# Patient Record
Sex: Male | Born: 1990 | Race: White | Hispanic: No | Marital: Single | Smoking: Never smoker
Health system: Southern US, Community
[De-identification: ages and names within clinical notes are randomized; demographics above are authoritative.]

## PROBLEM LIST (undated history)

## (undated) DIAGNOSIS — K219 Gastro-esophageal reflux disease without esophagitis: Secondary | ICD-10-CM

## (undated) DIAGNOSIS — T7840XA Allergy, unspecified, initial encounter: Secondary | ICD-10-CM

## (undated) HISTORY — DX: Allergy, unspecified, initial encounter: T78.40XA

## (undated) HISTORY — PX: NO PAST SURGERIES: SHX2092

---
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
## 2021-03-27 ENCOUNTER — Other Ambulatory Visit: Payer: Self-pay

## 2021-03-27 ENCOUNTER — Ambulatory Visit
Admission: EM | Admit: 2021-03-27 | Discharge: 2021-03-27 | Disposition: A | Discharge status: Home / Self Care | Payer: BC Managed Care – PPO

## 2021-03-27 ENCOUNTER — Ambulatory Visit (INDEPENDENT_AMBULATORY_CARE_PROVIDER_SITE_OTHER): Payer: BC Managed Care – PPO

## 2021-03-27 DIAGNOSIS — S93521A Sprain of metatarsophalangeal joint of right great toe, initial encounter: Secondary | ICD-10-CM

## 2021-03-27 DIAGNOSIS — M79674 Pain in right toe(s): Secondary | ICD-10-CM | POA: Diagnosis not present

## 2021-03-27 HISTORY — DX: Gastro-esophageal reflux disease without esophagitis: K21.9

## 2021-03-27 NOTE — ED Provider Notes (Signed)
[facility]    CSN: [id number] Arrival date & time: 03/27/21  1342      History   Chief Complaint Chief Complaint  Patient presents with   Toe Pain    Right big toe     HPI Dwayne Hill is a 30 y.o. male.   HPI  30 year old male here for evaluation of pain in his right big toe.  Patient reports that he has been experiencing pain in the proximal phalanx of his right big toe for the last 2 weeks.  He states that he is been experiencing some increase in swelling of the toe which brought him in today.  He denies any injury or redness but he does report that 2 weeks ago it did appear bruised.  Patient not had any numbness or tingling in the toe.  Patient reports that he stands on his feet all day at his job which makes the pain worse.  Patient reports that the pain reminds him of having turf toe when he was in high school.  He does endorse that there was redness and swelling along the medial edge of the toe back to the MTP joint a couple of days ago which has resolved.  Past Medical History:  Diagnosis Date   GERD (gastroesophageal reflux disease)     There are no problems to display for this patient.   Past Surgical History:  Procedure Laterality Date   NO PAST SURGERIES         Home Medications    Prior to Admission medications   Medication Sig Start Date End Date Taking? Authorizing Provider  omeprazole (PRILOSEC) 20 MG capsule Take 20 mg by mouth daily.   Yes [provider]    Family History Family History  Problem Relation Age of Onset   Healthy Mother    Hypertension Father     Social History Social History   Tobacco Use   Smoking status: Never   Smokeless tobacco: Current  Vaping Use   Vaping Use: Never used  Substance Use Topics   Alcohol use: Yes    Comment: occasionally   Drug use: Yes    Types: Marijuana     Allergies   Penicillins   Review of Systems Review of Systems  Musculoskeletal:  Positive for  arthralgias. Negative for joint swelling and myalgias.  Skin:  Negative for color change.  Hematological: Negative.   Psychiatric/Behavioral: Negative.      Physical Exam Triage Vital Signs ED Triage Vitals  Enc Vitals Group     BP 03/27/21 1422 (!) 155/99     Pulse Rate 03/27/21 1422 62     Resp 03/27/21 1422 16     Temp 03/27/21 1422 98.2 F (36.8 C)     Temp Source 03/27/21 1422 Oral     SpO2 03/27/21 1422 99 %     Weight 03/27/21 1419 (!) 320 lb (145.2 kg)     Height 03/27/21 1419 6\' 2"  (1.88 m)     Head Circumference --      Peak Flow --      Pain Score 03/27/21 1419 6     Pain Loc --      Pain Edu? --      Excl. in GC? --    No data found.  Updated Vital Signs BP (!) 155/99 (BP Location: Right Arm)   Pulse 62   Temp 98.2 F (36.8 C) (Oral)   Resp 16   Ht 6\' 2"  (1.88 m)  Wt (!) 320 lb (145.2 kg)   SpO2 99%   BMI 41.09 kg/m   Visual Acuity Right Eye Distance:   Left Eye Distance:   Bilateral Distance:    Right Eye Near:   Left Eye Near:    Bilateral Near:     Physical Exam Vitals and nursing note reviewed.  Constitutional:      General: He is not in acute distress.    Appearance: Normal appearance. He is obese. He is not ill-appearing.  Musculoskeletal:        General: Swelling and tenderness present. No deformity. Normal range of motion.  Skin:    General: Skin is warm and dry.     Capillary Refill: Capillary refill takes less than 2 seconds.     Findings: No bruising or erythema.  Neurological:     General: No focal deficit present.     Mental Status: He is alert and oriented to person, place, and time.     Sensory: No sensory deficit.  Psychiatric:        Mood and Affect: Mood normal.        Behavior: Behavior normal.        Thought Content: Thought content normal.        Judgment: Judgment normal.     UC Treatments / Results  Labs (all labs ordered are listed, but only abnormal results are displayed) Labs Reviewed - No data to  display  EKG   Radiology DG Toe Great Right  Result Date: 03/27/2021 CLINICAL DATA:  Right great toe pain for the past 2 weeks. No injury. EXAM: RIGHT GREAT TOE COMPARISON:  None. FINDINGS: There is no evidence of fracture or dislocation. There is no evidence of arthropathy or other focal bone abnormality. Soft tissues are unremarkable. IMPRESSION: Negative. Electronically Signed   By: Obie Dredge M.D.   On: 03/27/2021 15:44    Procedures Procedures (including critical care time)  Medications Ordered in UC Medications - No data to display  Initial Impression / Assessment and Plan / UC Course  I have reviewed the triage vital signs and the nursing notes.  Pertinent labs & imaging results that were available during my care of the patient were reviewed by me and considered in my medical decision making (see chart for details).  Patient is a nontoxic-appearing 30 year old male here for evaluation of 2 weeks worth of right great toe pain that is is occurring outside the setting of any injury.  Patient's physical exam reveals her right great toe there is a normal anatomical alignment.  There is no tenderness with palpation of the MTP or the IP joints.  Patient does have tenderness with palpation of the proximal phalanx of the great toe.  There is mild erythema to the toe with out induration, fluctuance, or heat.  There is very minimal edema present.  Cap refill less than 2 seconds.  Patient has full range of motion of the toe.  Will obtain radiograph of right great toe to look for possible stress fracture.  X-ray of right great toe independently reviewed and evaluated by me.  No evidence of fracture or dislocation.  Awaiting radiology overread. Radiology over read is also negative for fracture or dislocation.  Will discharge patient home with a diagnosis of right great toe sprain and treat with NSAIDs, elevation, and warm Epsom salt soaks.   Final Clinical Impressions(s) / UC Diagnoses    Final diagnoses:  Turf toe of right foot     Discharge Instructions  Use over-the-counter ibuprofen, 600 mg (3 tablets) every 6 hours with food, as needed for pain and inflammation.  Keep your right foot elevated is much as possible to help decrease swelling and aid in pain relief.  Continue to soak your foot twice daily in warm water and Epsom salts to help improve circulation.  Return for reevaluation for any new or worsening symptoms.     ED Prescriptions   None    PDMP not reviewed this encounter.   Becky Augusta, NP 03/27/21 [phone]

## 2021-03-27 NOTE — ED Triage Notes (Signed)
Patient complains of right big toe pain that started around 2 weeks ago without relief. States that yesterday it was very swollen. Denies known injury.

## 2021-03-27 NOTE — Discharge Instructions (Addendum)
Use over-the-counter ibuprofen, 600 mg (3 tablets) every 6 hours with food, as needed for pain and inflammation.  Keep your right foot elevated is much as possible to help decrease swelling and aid in pain relief.  Continue to soak your foot twice daily in warm water and Epsom salts to help improve circulation.  Return for reevaluation for any new or worsening symptoms.

## 2022-03-05 ENCOUNTER — Encounter: Payer: Self-pay | Admitting: Physician Assistant

## 2022-03-05 ENCOUNTER — Ambulatory Visit: Payer: BC Managed Care – PPO | Admitting: Physician Assistant

## 2022-03-05 ENCOUNTER — Ambulatory Visit: Payer: Self-pay | Admitting: Physician Assistant

## 2022-03-05 VITALS — BP 144/90 | HR 67 | Temp 98.4°F | Resp 16 | Ht 74.0 in | Wt 339.9 lb

## 2022-03-05 DIAGNOSIS — R030 Elevated blood-pressure reading, without diagnosis of hypertension: Secondary | ICD-10-CM | POA: Diagnosis not present

## 2022-03-05 DIAGNOSIS — M5489 Other dorsalgia: Secondary | ICD-10-CM | POA: Diagnosis not present

## 2022-03-05 DIAGNOSIS — R29818 Other symptoms and signs involving the nervous system: Secondary | ICD-10-CM | POA: Insufficient documentation

## 2022-03-05 DIAGNOSIS — K219 Gastro-esophageal reflux disease without esophagitis: Secondary | ICD-10-CM | POA: Diagnosis not present

## 2022-03-05 DIAGNOSIS — R5383 Other fatigue: Secondary | ICD-10-CM | POA: Insufficient documentation

## 2022-03-05 DIAGNOSIS — Z0000 Encounter for general adult medical examination without abnormal findings: Secondary | ICD-10-CM

## 2022-03-05 NOTE — Progress Notes (Signed)
I,Dwayne Hill,acting as a Neurosurgeon for [employer], PA-C.,have documented all relevant documentation on the behalf of Dwayne Lat, PA-C,as directed by  [employer], PA-C while in the presence of [employer], PA-C.  New Patient Office Visit  Subjective    Patient ID: Dwayne Hill, male    DOB: [birth year]  Age: 31 y.o. MRN: [id number]  CC: est care, GERD, knots, possible sleep apnea  Chief Complaint  Patient presents with   Establish Care    HPI Dwayne Hill is a 31 yr old male presenting to establish care.  Patient reports a knot just under each shoulder and right thigh.  Noticed about a yr ago.  Reports no pain or discomfort or increase in size.   Also reports heartburn. Omeprazole helps.   Patient's wife reports he stops breathing in his sleep for 30-45 seconds at a time every night.  Patient wonders if he has sleep apnea.    Outpatient Encounter Medications as of 03/05/2022  Medication Sig   omeprazole (PRILOSEC) 20 MG capsule Take 20 mg by mouth daily.   No facility-administered encounter medications on file as of 03/05/2022.    Past Medical History:  Diagnosis Date   Allergy    GERD (gastroesophageal reflux disease)     Past Surgical History:  Procedure Laterality Date   NO PAST SURGERIES      Family History  Problem Relation Age of Onset   Healthy Mother    Hypertension Father    Depression Father    Bipolar disorder Father     Social History   Socioeconomic History   Marital status: Single    Spouse name: Not on file   Number of children: Not on file   Years of education: Not on file   Highest education level: Not on file  Occupational History   Not on file  Tobacco Use   Smoking status: Never   Smokeless tobacco: Current    Types: Snuff  Vaping Use   Vaping Use: Never used  Substance and Sexual Activity   Alcohol use: Yes    Alcohol/week: 24.0 standard drinks of alcohol    Types: 24 Cans of beer per week    Comment: weekly   Drug  use: Yes    Types: Marijuana   Sexual activity: Not on file  Other Topics Concern   Not on file  Social History Narrative   Not on file   Social Determinants of Health   Financial Resource Strain: Not on file  Food Insecurity: Not on file  Transportation Needs: Not on file  Physical Activity: Not on file  Stress: Not on file  Social Connections: Not on file  Intimate Partner Violence: Not on file    Review of Systems  Constitutional:  Positive for malaise/fatigue.  Musculoskeletal:  Positive for back pain and neck pain.  Endo/Heme/Allergies:  Positive for environmental allergies.  All other systems reviewed and are negative.       Objective    BP (!) 144/90 (BP Location: Left Arm, Patient Position: Sitting, Cuff Size: Large)   Pulse 67   Temp 98.4 F (36.9 C) (Oral)   Resp 16   Ht 6\' 2"  (1.88 m)   Wt (!) 339 lb 14.4 oz (154.2 kg)   SpO2 97%   BMI 43.64 kg/m   Physical Exam Vitals reviewed.  Constitutional:      General: He is not in acute distress.    Appearance: Normal appearance. He is well-developed. He is  obese. He is not ill-appearing, toxic-appearing or diaphoretic.  HENT:     Head: Normocephalic and atraumatic.     Right Ear: Tympanic membrane, ear canal and external ear normal.     Left Ear: Tympanic membrane, ear canal and external ear normal.     Nose: Nose normal.     Mouth/Throat:     Mouth: Mucous membranes are moist.     Pharynx: Oropharynx is clear. No oropharyngeal exudate or posterior oropharyngeal erythema.  Eyes:     General: No scleral icterus.    Extraocular Movements: Extraocular movements intact.     Conjunctiva/sclera: Conjunctivae normal.     Pupils: Pupils are equal, round, and reactive to light.  Neck:     Thyroid: No thyromegaly.  Cardiovascular:     Rate and Rhythm: Normal rate and regular rhythm.     Pulses: Normal pulses.     Heart sounds: Normal heart sounds. No murmur heard. Pulmonary:     Effort: Pulmonary effort is  normal. No respiratory distress.     Breath sounds: Normal breath sounds. No wheezing or rales.  Abdominal:     General: Abdomen is flat. Bowel sounds are normal. There is no distension.     Palpations: Abdomen is soft.     Tenderness: There is no abdominal tenderness.  Musculoskeletal:        General: No deformity. Normal range of motion.     Cervical back: Normal range of motion and neck supple.     Right lower leg: No edema.     Left lower leg: No edema.  Lymphadenopathy:     Cervical: No cervical adenopathy.  Skin:    General: Skin is warm and dry.     Findings: No rash.  Neurological:     General: No focal deficit present.     Mental Status: He is alert and oriented to person, place, and time. Mental status is at baseline.     Cranial Nerves: No cranial nerve deficit.     Sensory: No sensory deficit.     Motor: No weakness.     Gait: Gait normal.  Psychiatric:        Mood and Affect: Mood normal.        Behavior: Behavior normal.        Thought Content: Thought content normal.        Judgment: Judgment normal.         Assessment & Plan:   Problem List Items Addressed This Visit   None Visit Diagnoses     Gastroesophageal reflux disease without esophagitis    -  Primary   Back pain without sciatica       Morbid obesity ([place])       Relevant Orders   CBC with Differential/Platelet   Comprehensive metabolic panel   TSH   Lipid panel   Ambulatory referral to Sleep Studies   Elevated BP without diagnosis of hypertension       Encounter for medical examination to establish care       Suspected sleep apnea       Fatigue, unspecified type         Gastroesophageal reflux disease without esophagitis Continue Omeprazole 20 mg daily Elevate the head of the bed 6-8 inches, avoid recumbency for 3 hours after eating, avoid food as a delayed gastric emptying, weight loss    Back pain without sciatica Proceed with topical antiinflammatory Advised to proceed with OTC  pain medications, topical anti-inflammatory, heat/ice compresses  Recommended to start PT. Pt prefers to wait    Morbid obesity ([facility]) Initial workup: - CBC with Differential/Platelet - Comprehensive metabolic panel - TSH - Lipid panel Lifestyle modifications recommended   Elevated BP without diagnosis of hypertension BP today was 144/90 Pt was encouraged to measure his bp at home and bring his bp log with him to the next appt  Fatigue Initial workup: - TSH, CBC, CMP  Suspected sleep apnea Needs sleep studies  Establish care  FU in 1 mo  CPE in 2 mo  The patient was advised to call back or seek an in-person evaluation if the symptoms worsen or if the condition fails to improve as anticipated.  I discussed the assessment and treatment plan with the patient. The patient was provided an opportunity to ask questions and all were answered. The patient agreed with the plan and demonstrated an understanding of the instructions.  The entirety of the information documented in the History of Present Illness, Review of Systems and Physical Exam were personally obtained by me. Portions of this information were initially documented by the CMA and reviewed by me for thoroughness and accuracy.  Portions of this note were created using dictation software and may contain typographical errors.       Total encounter time more than 45 minutes Greater than 50% was spent in counseling and coordination of care with the patient  Dwayne Hill, [facility], MMS [facility] [phone] (phone) [phone] (fax)

## 2022-03-06 LAB — CBC WITH DIFFERENTIAL/PLATELET
Basophils Absolute: 0.1 10*3/uL (ref 0.0–0.2)
Basos: 1 %
EOS (ABSOLUTE): 0.3 10*3/uL (ref 0.0–0.4)
Eos: 2 %
Hematocrit: 44.7 % (ref 37.5–51.0)
Hemoglobin: 15.2 g/dL (ref 13.0–17.7)
Immature Grans (Abs): 0.1 10*3/uL (ref 0.0–0.1)
Immature Granulocytes: 1 %
Lymphocytes Absolute: 2.5 10*3/uL (ref 0.7–3.1)
Lymphs: 19 %
MCH: 28.3 pg (ref 26.6–33.0)
MCHC: 34 g/dL (ref 31.5–35.7)
MCV: 83 fL (ref 79–97)
Monocytes Absolute: 0.9 10*3/uL (ref 0.1–0.9)
Monocytes: 7 %
Neutrophils Absolute: 9.2 10*3/uL — ABNORMAL HIGH (ref 1.4–7.0)
Neutrophils: 70 %
Platelets: 320 10*3/uL (ref 150–450)
RBC: 5.37 x10E6/uL (ref 4.14–5.80)
RDW: 12.8 % (ref 11.6–15.4)
WBC: 13 10*3/uL — ABNORMAL HIGH (ref 3.4–10.8)

## 2022-03-06 LAB — TSH: TSH: 3.68 u[IU]/mL (ref 0.450–4.500)

## 2022-03-06 LAB — COMPREHENSIVE METABOLIC PANEL
ALT: 26 IU/L (ref 0–44)
AST: 21 IU/L (ref 0–40)
Albumin/Globulin Ratio: 2.2 (ref 1.2–2.2)
Albumin: 4.9 g/dL (ref 4.1–5.2)
Alkaline Phosphatase: 99 IU/L (ref 44–121)
BUN/Creatinine Ratio: 14 (ref 9–20)
BUN: 12 mg/dL (ref 6–20)
Bilirubin Total: 0.3 mg/dL (ref 0.0–1.2)
CO2: 24 mmol/L (ref 20–29)
Calcium: 9.9 mg/dL (ref 8.7–10.2)
Chloride: 101 mmol/L (ref 96–106)
Creatinine, Ser: 0.87 mg/dL (ref 0.76–1.27)
Globulin, Total: 2.2 g/dL (ref 1.5–4.5)
Glucose: 80 mg/dL (ref 70–99)
Potassium: 4.4 mmol/L (ref 3.5–5.2)
Sodium: 139 mmol/L (ref 134–144)
Total Protein: 7.1 g/dL (ref 6.0–8.5)
eGFR: 120 mL/min/{1.73_m2} (ref >59)

## 2022-03-06 LAB — LIPID PANEL
Chol/HDL Ratio: 4.5 ratio (ref 0.0–5.0)
Cholesterol, Total: 200 mg/dL — ABNORMAL HIGH (ref 100–199)
HDL: 44 mg/dL (ref >39)
LDL Chol Calc (NIH): 117 mg/dL — ABNORMAL HIGH (ref 0–99)
Triglycerides: 226 mg/dL — ABNORMAL HIGH (ref 0–149)
VLDL Cholesterol Cal: 39 mg/dL (ref 5–40)

## 2022-03-07 NOTE — Progress Notes (Signed)
Hello Gerlene Fee ,   Your labwork results all are within normal limits, except some increase in white blood cells /neutrophils. It could be secondary to your current problems with GERD. We could recheck them at your next appt. Also, your cholesterol and lipids are slightly elevated. Lifestyle modifications are recommended: healthy diet, regular exercise and weight loss.  Any questions please reach out to the office or message me on MyChart! Best, [name], PA-C

## 2022-03-16 DIAGNOSIS — G4730 Sleep apnea, unspecified: Secondary | ICD-10-CM | POA: Diagnosis not present

## 2022-04-03 NOTE — Progress Notes (Deleted)
    I,Jana Analiya Porco,acting as a Neurosurgeon for [employer], PA-C.,have documented all relevant documentation on the behalf of [name], PA-C,as directed by  [employer], PA-C while in the presence of [employer], PA-C.  Established patient visit   Patient: Dwayne Hill   DOB: Dec 07, 1990   31 y.o. Male  MRN: [id number] Visit Date: 04/04/2022  Today's healthcare provider: [name], PA-C   No chief complaint on file. Follow up GERD, back pain, elevated GP Subjective    GERD, Follow up:  The patient was last seen for GERD 1 months ago. Changes made since that visit include continue Omeprazole. Elevate the head of the bed 6-8 inches, avoid recumbency for 3 hours after eating, avoid food as a delayed gastric emptying, weight loss    He reports {excellent/good/fair/poor:19665} compliance with treatment. He {is/is not:21021397} having side effects. ***.  -----------------------------------------------------------------------------------------  Follow up for back pain  The patient was last seen for this 1 months ago. Changes made at last visit include .Proceed with topical antiinflammatory Advised to proceed with OTC pain medications, topical anti-inflammatory, heat/ice compresses Recommended to start PT. Pt prefers to wait  He reports {excellent/good/fair/poor:19665} compliance with treatment. He feels that condition is {improved/worse/unchanged:3041574}. He {is/is not:21021397} having side effects. ***  ----------------------------------------------------------------------------------------- Follow up for elevated BP   The patient was last seen for this 1 months ago. Changes made at last visit include: BP today was 144/90 Pt was encouraged to measure his bp at home and bring his bp log with him to the next appt He reports {excellent/good/fair/poor:19665} compliance with treatment. He feels that condition is {improved/worse/unchanged:3041574}. He {is/is not:21021397}  having side effects. ***  -----------------------------------------------------------------------------------------  Medications: Outpatient Medications Prior to Visit  Medication Sig   omeprazole (PRILOSEC) 20 MG capsule Take 20 mg by mouth daily.   No facility-administered medications prior to visit.    Review of Systems  {Labs  Heme  Chem  Endocrine  Serology  Results Review (optional):23779}   Objective    There were no vitals taken for this visit. {Show previous vital signs (optional):23777}  Physical Exam  ***  No results found for any visits on 04/04/22.  Assessment & Plan     ***  No follow-ups on file.      {provider attestation***:1}   [name], Cordelia Poche  [facility] [phone] (phone) [phone] (fax)  [facility]

## 2022-04-04 ENCOUNTER — Ambulatory Visit: Payer: BC Managed Care – PPO | Admitting: Physician Assistant

## 2022-04-05 ENCOUNTER — Telehealth: Payer: Self-pay

## 2022-04-05 NOTE — Telephone Encounter (Signed)
Copied from CRM [id number]. Topic: General - Inquiry >> Apr 05, 2022  4:25 PM Haroldine Laws wrote: Reason for CRM: Carollee Herter may with [employer] called saying she was faxing over some paperwork and would like provider to ccall her if she has any questions.  It is regarding his sleep study  [phone]

## 2022-04-18 NOTE — Progress Notes (Signed)
I,Dwayne Hill,acting as a Neurosurgeon for [employer], PA-C.,have documented all relevant documentation on the behalf of Dwayne Lat, PA-C,as directed by  [employer], PA-C while in the presence of [employer], PA-C.   Complete physical exam   Patient: Dwayne Hill   DOB: [birth year]   30 y.o. Male  MRN: [id number] Visit Date: 04/19/2022  Today's healthcare provider: [name], PA-C   Chief Complaint  Patient presents with   Annual Exam   Subjective    Dwayne Hill is a 31 y.o. male who presents today for a complete physical exam.  He reports consuming a general diet.Marland Kitchen He generally feels fairly well. He reports sleeping poorly. He does have additional problems to discuss today.  Trouble sleeping/Reports he has CPAP on the way.   Patient is taking Skald supplement for "fat burning and weight lose." Does exercise regularly  Past Medical History:  Diagnosis Date   Allergy    GERD (gastroesophageal reflux disease)    Past Surgical History:  Procedure Laterality Date   NO PAST SURGERIES     Social History   Socioeconomic History   Marital status: Single    Spouse name: Not on file   Number of children: Not on file   Years of education: Not on file   Highest education level: Not on file  Occupational History   Not on file  Tobacco Use   Smoking status: Never   Smokeless tobacco: Current    Types: Snuff  Vaping Use   Vaping Use: Never used  Substance and Sexual Activity   Alcohol use: Yes    Alcohol/week: 24.0 standard drinks of alcohol    Types: 24 Cans of beer per week    Comment: weekly   Drug use: Yes    Types: Marijuana   Sexual activity: Not on file  Other Topics Concern   Not on file  Social History Narrative   Not on file   Social Determinants of Health   Financial Resource Strain: Not on file  Food Insecurity: Not on file  Transportation Needs: Not on file  Physical Activity: Not on file  Stress: Not on file  Social Connections:  Not on file  Intimate Partner Violence: Not on file   Family Status  Relation Name Status   Mother  Alive   Father  Alive   Family History  Problem Relation Age of Onset   Healthy Mother    Hypertension Father    Depression Father    Bipolar disorder Father    Allergies  Allergen Reactions   Penicillins Hives and Shortness Of Breath    Patient Care Team: Dwayne Lat, PA-C as PCP - General (Physician Assistant)   Medications: Outpatient Medications Prior to Visit  Medication Sig   omeprazole (PRILOSEC) 20 MG capsule Take 20 mg by mouth daily.   No facility-administered medications prior to visit.    Review of Systems  Constitutional:  Positive for fatigue.  Psychiatric/Behavioral:  Positive for sleep disturbance.   All other systems reviewed and are negative.     Objective      Vitals:   04/19/22 0849 04/19/22 0857  BP: (!) 142/86 (!) 138/98  Pulse: 73   Resp: 16   Temp: 98.1 F (36.7 C)   SpO2: 100%     Physical Exam Vitals reviewed.  Constitutional:      General: He is not in acute distress.    Appearance: Normal appearance. He is well-developed. He is not diaphoretic.  HENT:     Head: Normocephalic and atraumatic.     Right Ear: Tympanic membrane, ear canal and external ear normal.     Left Ear: Tympanic membrane, ear canal and external ear normal.     Nose: Nose normal.     Mouth/Throat:     Mouth: Mucous membranes are moist.     Pharynx: Oropharynx is clear. No oropharyngeal exudate.  Eyes:     General: No scleral icterus.    Conjunctiva/sclera: Conjunctivae normal.     Pupils: Pupils are equal, round, and reactive to light.  Neck:     Thyroid: No thyromegaly.  Cardiovascular:     Rate and Rhythm: Normal rate and regular rhythm.     Pulses: Normal pulses.     Heart sounds: Normal heart sounds. No murmur heard. Pulmonary:     Effort: Pulmonary effort is normal. No respiratory distress.     Breath sounds: Normal breath sounds. No  wheezing or rales.  Abdominal:     General: There is no distension.     Palpations: Abdomen is soft.     Tenderness: There is no abdominal tenderness.  Musculoskeletal:        General: No deformity.     Cervical back: Neck supple.     Right lower leg: No edema.     Left lower leg: No edema.  Lymphadenopathy:     Cervical: No cervical adenopathy.  Skin:    General: Skin is warm and dry.     Findings: No rash.  Neurological:     Mental Status: He is alert and oriented to person, place, and time. Mental status is at baseline.     Sensory: No sensory deficit.     Motor: No weakness.     Gait: Gait normal.  Psychiatric:        Mood and Affect: Mood normal.        Behavior: Behavior normal.        Thought Content: Thought content normal.       Last depression screening scores    04/19/2022    8:52 AM 03/05/2022    1:51 PM  PHQ 2/9 Scores  PHQ - 2 Score 2 0  PHQ- 9 Score 6 5   Last fall risk screening    04/19/2022    8:52 AM  Fall Risk   Falls in the past year? 0  Number falls in past yr: 0  Injury with Fall? 0  Follow up Falls evaluation completed   Last Audit-C alcohol use screening    04/19/2022    8:53 AM  Alcohol Use Disorder Test (AUDIT)  1. How often do you have a drink containing alcohol? 4  2. How many drinks containing alcohol do you have on a typical day when you are drinking? 3  3. How often do you have six or more drinks on one occasion? 4  AUDIT-C Score 11   A score of 3 or more in women, and 4 or more in men indicates increased risk for alcohol abuse, EXCEPT if all of the points are from question 1   No results found for any visits on 04/19/22.  Assessment & Plan    Routine Health Maintenance and Physical Exam  Exercise Activities and Dietary recommendations  Goals   Weight loss of 5% of current weight via healthy diet and daily exercise recommended      There is no immunization history on file for this patient.  Health Maintenance  Topic  Date Due   COVID-19 Vaccine (1) Never done   HIV Screening  Never done   Hepatitis C Screening  Never done   TETANUS/TDAP  Never done   INFLUENZA VACCINE  04/17/2022   HPV VACCINES  Aged Out    Discussed health benefits of physical activity, and encouraged him to engage in regular exercise appropriate for his age and condition. 1. Gastroesophageal reflux disease without esophagitis  Continue PPI Elevate the head of the bed 6-8 inches, avoid recumbency for 3 hours after eating, avoid food as a delayed gastric emptying, weight loss   2. Elevated BP without diagnosis of hypertension  BP 138/98  Measure BP at home Might need to start BP medication Weight loss of 5 % of the current weight advised  3. Back pain without sciatica    Symptomatic treatment  4. Fatigue, unspecified type Could be related to sleep apnea Will reassess after using CPAP machine  5. Need for hepatitis C screening test  - Hepatitis C antibody  6. Encounter for screening for HIV  - HIV Antibody (routine testing w rflx)      FU? The patient was advised to call back or seek an in-person evaluation if the symptoms worsen or if the condition fails to improve as anticipated.  I discussed the assessment and treatment plan with the patient. The patient was provided an opportunity to ask questions and all were answered. The patient agreed with the plan and demonstrated an understanding of the instructions.  The entirety of the information documented in the History of Present Illness, Review of Systems and Physical Exam were personally obtained by me. Portions of this information were initially documented by the CMA and reviewed by me for thoroughness and accuracy.  Portions of this note were created using dictation software and may contain typographical errors.     Dwayne Lat, PA-C  [facility] [phone] (phone) [phone] (fax)  [facility]

## 2022-04-19 ENCOUNTER — Encounter: Payer: Self-pay | Admitting: Physician Assistant

## 2022-04-19 ENCOUNTER — Ambulatory Visit (INDEPENDENT_AMBULATORY_CARE_PROVIDER_SITE_OTHER): Payer: BC Managed Care – PPO | Admitting: Physician Assistant

## 2022-04-19 VITALS — BP 138/98 | HR 73 | Temp 98.1°F | Resp 16 | Ht 74.4 in | Wt 333.3 lb

## 2022-04-19 DIAGNOSIS — R5383 Other fatigue: Secondary | ICD-10-CM | POA: Diagnosis not present

## 2022-04-19 DIAGNOSIS — Z23 Encounter for immunization: Secondary | ICD-10-CM

## 2022-04-19 DIAGNOSIS — R030 Elevated blood-pressure reading, without diagnosis of hypertension: Secondary | ICD-10-CM | POA: Diagnosis not present

## 2022-04-19 DIAGNOSIS — M5489 Other dorsalgia: Secondary | ICD-10-CM | POA: Diagnosis not present

## 2022-04-19 DIAGNOSIS — K219 Gastro-esophageal reflux disease without esophagitis: Secondary | ICD-10-CM

## 2022-04-19 DIAGNOSIS — Z114 Encounter for screening for human immunodeficiency virus [HIV]: Secondary | ICD-10-CM

## 2022-04-19 DIAGNOSIS — Z1159 Encounter for screening for other viral diseases: Secondary | ICD-10-CM

## 2022-04-19 DIAGNOSIS — Z0000 Encounter for general adult medical examination without abnormal findings: Secondary | ICD-10-CM

## 2022-04-30 DIAGNOSIS — G4733 Obstructive sleep apnea (adult) (pediatric): Secondary | ICD-10-CM | POA: Diagnosis not present

## 2022-04-30 DIAGNOSIS — R0683 Snoring: Secondary | ICD-10-CM | POA: Diagnosis not present

## 2022-05-01 DIAGNOSIS — K219 Gastro-esophageal reflux disease without esophagitis: Secondary | ICD-10-CM | POA: Diagnosis not present

## 2022-05-31 DIAGNOSIS — G4733 Obstructive sleep apnea (adult) (pediatric): Secondary | ICD-10-CM | POA: Diagnosis not present

## 2022-05-31 DIAGNOSIS — R0683 Snoring: Secondary | ICD-10-CM | POA: Diagnosis not present

## 2022-06-01 ENCOUNTER — Ambulatory Visit: Payer: BC Managed Care – PPO | Admitting: Physician Assistant

## 2022-06-01 ENCOUNTER — Encounter: Payer: Self-pay | Admitting: Physician Assistant

## 2022-06-01 VITALS — BP 140/78 | HR 80 | Temp 98.5°F | Wt 333.0 lb

## 2022-06-01 DIAGNOSIS — R030 Elevated blood-pressure reading, without diagnosis of hypertension: Secondary | ICD-10-CM | POA: Diagnosis not present

## 2022-06-01 DIAGNOSIS — Z23 Encounter for immunization: Secondary | ICD-10-CM | POA: Diagnosis not present

## 2022-06-01 DIAGNOSIS — G4733 Obstructive sleep apnea (adult) (pediatric): Secondary | ICD-10-CM

## 2022-06-01 DIAGNOSIS — K219 Gastro-esophageal reflux disease without esophagitis: Secondary | ICD-10-CM | POA: Diagnosis not present

## 2022-06-01 MED ORDER — LOSARTAN POTASSIUM 25 MG PO TABS: 25.0000 mg | ORAL_TABLET | Freq: Every day | ORAL | 0 refills | Status: AC

## 2022-06-01 NOTE — Progress Notes (Unsigned)
I,Roshena L Chambers,acting as a Education administrator for [employer], PA-C.,have documented all relevant documentation on the behalf of Mardene Speak, PA-C,as directed by  [employer], PA-C while in the presence of [employer], PA-C.  Established patient visit   Patient: Dwayne Hill   DOB: 10-Feb-1991   31 y.o. Male  MRN: [id number] Visit Date: 06/01/2022  Today's healthcare provider: Mardene Speak, PA-C   Chief Complaint  Patient presents with   Hypertension   Subjective    HPI  Hypertension, follow-up  BP Readings from Last 3 Encounters:  04/19/22 (!) 138/98  03/05/22 (!) 144/90  03/27/21 (!) 155/99   Wt Readings from Last 3 Encounters:  06/01/22 (!) 333 lb (151 kg)  04/19/22 (!) 333 lb 4.8 oz (151.2 kg)  03/05/22 (!) 339 lb 14.4 oz (154.2 kg)     He was last seen for hypertension on 04/19/2022.   BP at that visit was 138/98. Management since that visit includes encouraging patient to work on weight loss.  He reports good compliance with treatment. He is not having side effects.  He is following a Regular diet. He is not exercising. He does not smoke.  Use of agents associated with hypertension: none.   Outside blood pressures are not checked. Symptoms: No chest pain No chest pressure  No palpitations No syncope  No dyspnea No orthopnea  No paroxysmal nocturnal dyspnea No lower extremity edema   Pertinent labs Lab Results  Component Value Date   CHOL 200 (H) 03/05/2022   HDL 44 03/05/2022   LDLCALC 117 (H) 03/05/2022   TRIG 226 (H) 03/05/2022   CHOLHDL 4.5 03/05/2022   Lab Results  Component Value Date   NA 139 03/05/2022   K 4.4 03/05/2022   CREATININE 0.87 03/05/2022   EGFR 120 03/05/2022   GLUCOSE 80 03/05/2022   TSH 3.680 03/05/2022     The ASCVD Risk score (Arnett DK, et al., 2019) failed to calculate for the following reasons:   The 2019 ASCVD risk score is only valid for ages 8 to  20  ---------------------------------------------------------------------------------------------------   Follow up for sleep apnea:  The patient was last seen for this 1  month  ago. Changes made at last visit include none; continue using CPAP.  He reports good compliance with treatment. He feels that condition is Improved. He is not having side effects.   -----------------------------------------------------------------------------------------   Medications: Outpatient Medications Prior to Visit  Medication Sig   omeprazole (PRILOSEC) 20 MG capsule Take 20 mg by mouth daily.   No facility-administered medications prior to visit.    Review of Systems  Constitutional:  Negative for appetite change, chills and fever.  Respiratory:  Negative for chest tightness, shortness of breath and wheezing.   Cardiovascular:  Negative for chest pain and palpitations.  Gastrointestinal:  Negative for abdominal pain, nausea and vomiting.    {Labs  Heme  Chem  Endocrine  Serology  Results Review (optional):23779}   Objective    Pulse 80   Temp 98.5 F (36.9 C) (Oral)   Wt (!) 333 lb (151 kg)   SpO2 98% Comment: room air  BMI 42.30 kg/m  {Show previous vital signs (optional):23777}  Physical Exam  ***  No results found for any visits on 06/01/22.  Assessment & Plan     ***  No follow-ups on file.      {provider attestation***:1}   Mardene Speak, Hershal Coria  [facility] [phone] (phone) [phone] (fax)  [facility]

## 2022-06-20 DIAGNOSIS — G4733 Obstructive sleep apnea (adult) (pediatric): Secondary | ICD-10-CM | POA: Diagnosis not present

## 2022-06-30 DIAGNOSIS — R0683 Snoring: Secondary | ICD-10-CM | POA: Diagnosis not present

## 2022-06-30 DIAGNOSIS — G4733 Obstructive sleep apnea (adult) (pediatric): Secondary | ICD-10-CM | POA: Diagnosis not present

## 2022-07-01 DIAGNOSIS — K219 Gastro-esophageal reflux disease without esophagitis: Secondary | ICD-10-CM | POA: Diagnosis not present

## 2022-07-21 DIAGNOSIS — G4733 Obstructive sleep apnea (adult) (pediatric): Secondary | ICD-10-CM | POA: Diagnosis not present

## 2022-07-31 DIAGNOSIS — R0683 Snoring: Secondary | ICD-10-CM | POA: Diagnosis not present

## 2022-07-31 DIAGNOSIS — G4733 Obstructive sleep apnea (adult) (pediatric): Secondary | ICD-10-CM | POA: Diagnosis not present

## 2022-08-20 DIAGNOSIS — G4733 Obstructive sleep apnea (adult) (pediatric): Secondary | ICD-10-CM | POA: Diagnosis not present

## 2022-10-11 DIAGNOSIS — G4733 Obstructive sleep apnea (adult) (pediatric): Secondary | ICD-10-CM | POA: Diagnosis not present

## 2022-11-11 DIAGNOSIS — G4733 Obstructive sleep apnea (adult) (pediatric): Secondary | ICD-10-CM | POA: Diagnosis not present

## 2022-12-10 DIAGNOSIS — G4733 Obstructive sleep apnea (adult) (pediatric): Secondary | ICD-10-CM | POA: Diagnosis not present

## 2023-01-04 ENCOUNTER — Ambulatory Visit
Admission: EM | Admit: 2023-01-04 | Discharge: 2023-01-04 | Disposition: A | Discharge status: Home / Self Care | Payer: Managed Care, Other (non HMO) | Attending: Nurse Practitioner | Admitting: Nurse Practitioner

## 2023-01-04 DIAGNOSIS — M722 Plantar fascial fibromatosis: Secondary | ICD-10-CM

## 2023-01-04 MED ORDER — NAPROXEN 500 MG PO TABS: 500.0000 mg | ORAL_TABLET | Freq: Two times a day (BID) | ORAL | 0 refills | Status: AC

## 2023-01-04 NOTE — ED Triage Notes (Signed)
Pt c/o right foot pain x10month  Pt states that he is having pain along the bottom of his foot. The pain moves from his heel to the arch of his foot and makes it difficult to walk.  Pt denies any abnormal physical activities, new shoes, or new medications.  Pt states that it feels like he is stepping on stones and the pain is a "deep bone bruise".

## 2023-01-04 NOTE — Discharge Instructions (Addendum)
Naproxen twice daily for 7 days Stretcher right foot on a water bottle or can to help stretch out your plantar fascia Use shoe inserts help support your foot Follow-up with podiatry if symptoms do not improve Please go to emergency room if you have any worsening symptoms

## 2023-01-04 NOTE — ED Provider Notes (Signed)
[facility]    CSN: [id number] Arrival date & time: 01/04/23  1201      History   Chief Complaint Chief Complaint  Patient presents with   Foot Pain    HPI CHANCELER Hill is a 32 y.o. male presents for evaluation of right foot pain x 1 months.  Patient denies any known injury or inciting event.  Reports was previously intermittent but now more persistent pain to the sole of his right foot from the mid sole that goes to his heel.  It is worse the longer he is off the foot so it is worse in the morning and improves as the day progresses.  No swelling, bruising, numbness/tingling.  Did get a new pair of tennis shoes recently.  Does wear flip-flops often.  No OTC medications have been used.  No other concerns at this time.   Foot Pain    Past Medical History:  Diagnosis Date   Allergy    GERD (gastroesophageal reflux disease)     Patient Active Problem List   Diagnosis Date Noted   Gastroesophageal reflux disease without esophagitis 03/05/2022   Back pain without sciatica 03/05/2022   Morbid obesity 03/05/2022   Elevated BP without diagnosis of hypertension 03/05/2022   Encounter for medical examination to establish care 03/05/2022   Suspected sleep apnea 03/05/2022   Fatigue 03/05/2022    Past Surgical History:  Procedure Laterality Date   NO PAST SURGERIES         Home Medications    Prior to Admission medications   Medication Sig Start Date End Date Taking? Authorizing Provider  losartan (COZAAR) 25 MG tablet Take 1 tablet (25 mg total) by mouth daily. 06/01/22  Yes Ostwalt, Edmon Crape, PA-C  naproxen (NAPROSYN) 500 MG tablet Take 1 tablet (500 mg total) by mouth 2 (two) times daily for 7 days. 01/04/23 01/11/23 Yes Radford Pax, NP  omeprazole (PRILOSEC) 20 MG capsule Take 20 mg by mouth daily.   Yes [provider]    Family History Family History  Problem Relation Age of Onset   Healthy Mother    Hypertension Father    Depression Father     Bipolar disorder Father     Social History Social History   Tobacco Use   Smoking status: Never   Smokeless tobacco: Current    Types: Snuff  Vaping Use   Vaping Use: Never used  Substance Use Topics   Alcohol use: Yes    Alcohol/week: 24.0 standard drinks of alcohol    Types: 24 Cans of beer per week    Comment: weekly   Drug use: Yes    Types: Marijuana     Allergies   Penicillins and Sulfa antibiotics   Review of Systems Review of Systems  Musculoskeletal:        Right foot pain      Physical Exam Triage Vital Signs ED Triage Vitals  Enc Vitals Group     BP 01/04/23 1220 (!) 157/95     Pulse Rate 01/04/23 1220 84     Resp --      Temp 01/04/23 1220 97.9 F (36.6 C)     Temp Source 01/04/23 1220 Oral     SpO2 01/04/23 1220 98 %     Weight 01/04/23 1218 (!) 345 lb (156.5 kg)     Height 01/04/23 1218  (1.88 m)     Head Circumference --      Peak Flow --  Pain Score 01/04/23 1218 9     Pain Loc --      Pain Edu? --      Excl. in GC? --    No data found.  Updated Vital Signs BP (!) 157/95 (BP Location: Left Arm)   Pulse 84   Temp 97.9 F (36.6 C) (Oral)   Ht  (1.88 m)   Wt (!) 345 lb (156.5 kg)   SpO2 98%   BMI 44.30 kg/m   Visual Acuity Right Eye Distance:   Left Eye Distance:   Bilateral Distance:    Right Eye Near:   Left Eye Near:    Bilateral Near:     Physical Exam Vitals and nursing note reviewed.  Constitutional:      Appearance: Normal appearance.  HENT:     Head: Normocephalic and atraumatic.  Eyes:     Pupils: Pupils are equal, round, and reactive to light.  Cardiovascular:     Rate and Rhythm: Normal rate.  Pulmonary:     Effort: Pulmonary effort is normal.  Musculoskeletal:       Feet:  Feet:     Comments: Tender to palpation to right plantar fascia with foot extended.  No swelling or bruising.  DP +2. Skin:    General: Skin is warm and dry.  Neurological:     General: No focal deficit present.      Mental Status: He is alert and oriented to person, place, and time.  Psychiatric:        Mood and Affect: Mood normal.        Behavior: Behavior normal.      UC Treatments / Results  Labs (all labs ordered are listed, but only abnormal results are displayed) Labs Reviewed - No data to display  EKG   Radiology No results found.  Procedures Procedures (including critical care time)  Medications Ordered in UC Medications - No data to display  Initial Impression / Assessment and Plan / UC Course  I have reviewed the triage vital signs and the nursing notes.  Pertinent labs & imaging results that were available during my care of the patient were reviewed by me and considered in my medical decision making (see chart for details).     Reviewed symptoms and exam with patient.  Discussed plantar fasciitis Discussed exercises such as rolling foot on water bottle, using shoe inserts Trial of naproxen twice daily for 7 days Podiatry follow-up if symptoms do not improve ER precautions reviewed and patient verbalized understanding Final Clinical Impressions(s) / UC Diagnoses   Final diagnoses:  Plantar fasciitis of right foot     Discharge Instructions      Naproxen twice daily for 7 days Stretcher right foot on a water bottle or can to help stretch out your plantar fascia Use shoe inserts help support your foot Follow-up with podiatry if symptoms do not improve Please go to emergency room if you have any worsening symptoms    ED Prescriptions     Medication Sig Dispense Auth. Provider   naproxen (NAPROSYN) 500 MG tablet Take 1 tablet (500 mg total) by mouth 2 (two) times daily for 7 days. 14 tablet Radford Pax, NP      PDMP not reviewed this encounter.   Radford Pax, NP 01/04/23 1241

## 2023-01-05 ENCOUNTER — Ambulatory Visit: Payer: Self-pay

## 2023-01-05 IMAGING — CR DG TOE GREAT 2+V*R*
4 series · 4 of 4 positions shown · non-contrast
Comparison: None.

CLINICAL DATA: Right great toe pain for the past 2 weeks. No
injury.

EXAM:
RIGHT GREAT TOE

[toe ap]
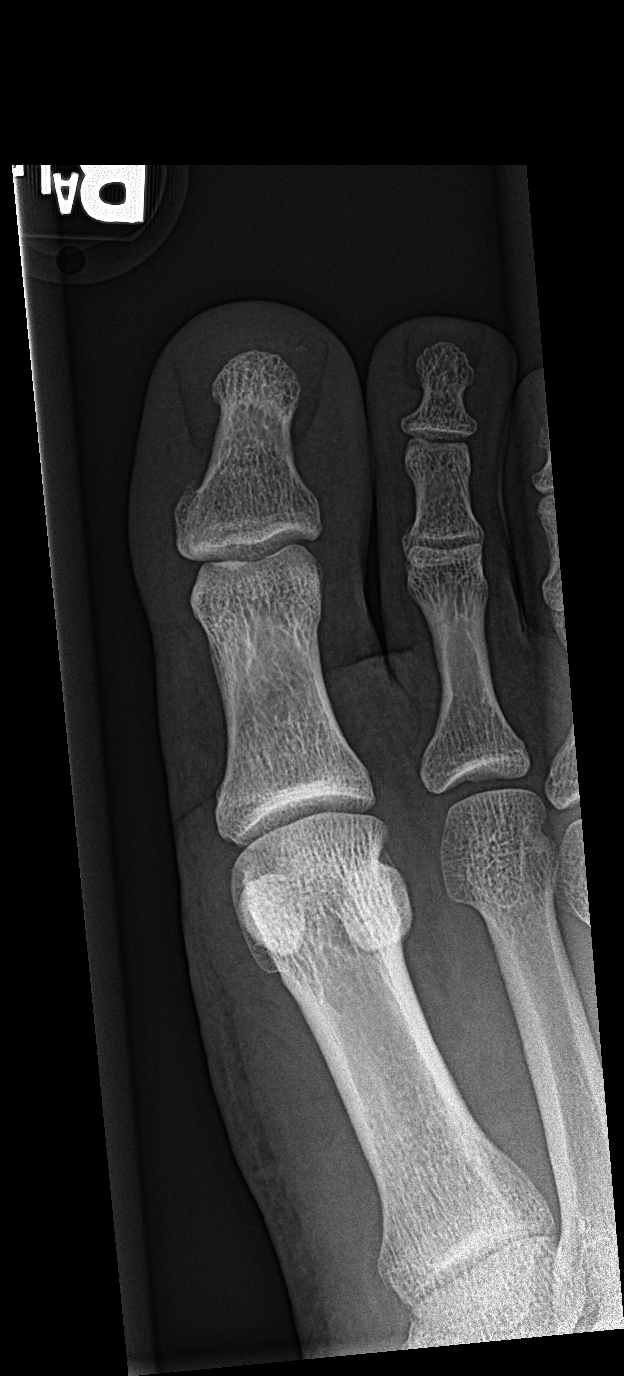

[toe obl]
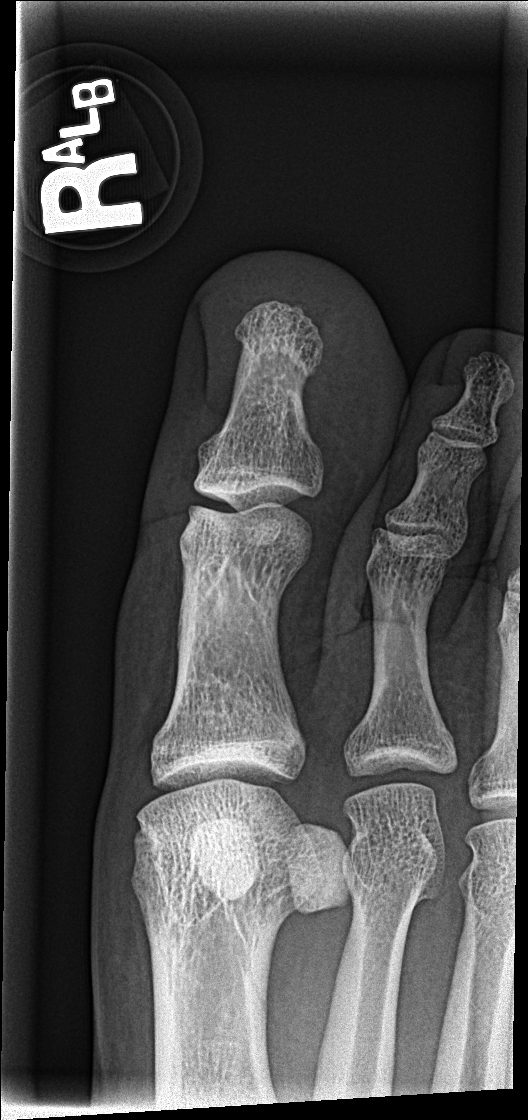

[toe lat (1 of 2)]
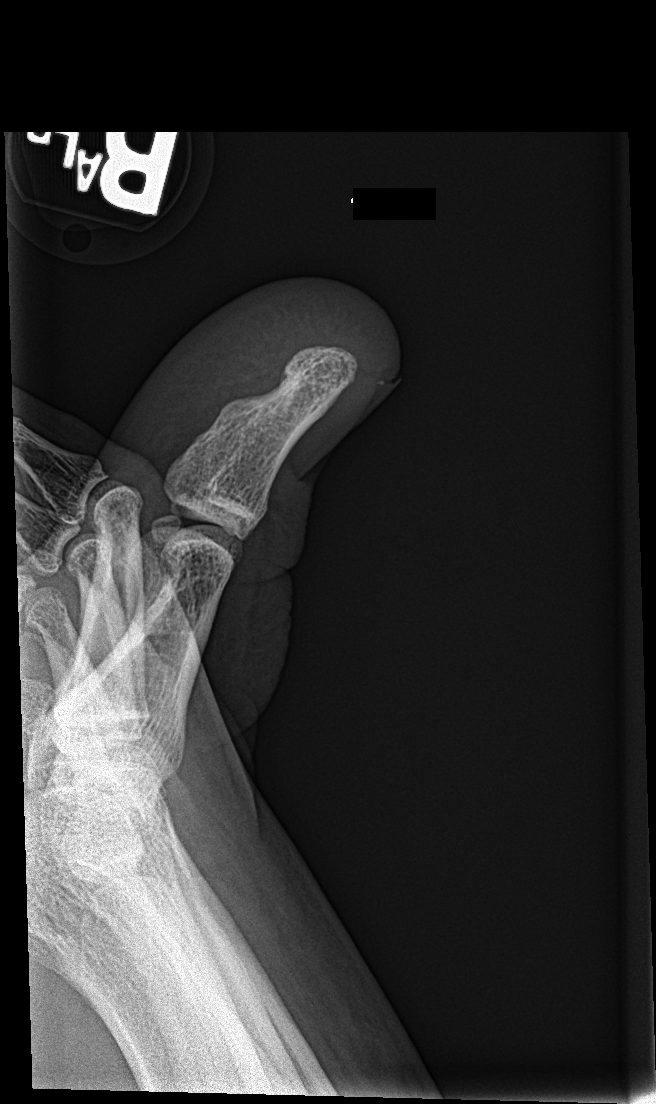

[toe lat (2 of 2)]
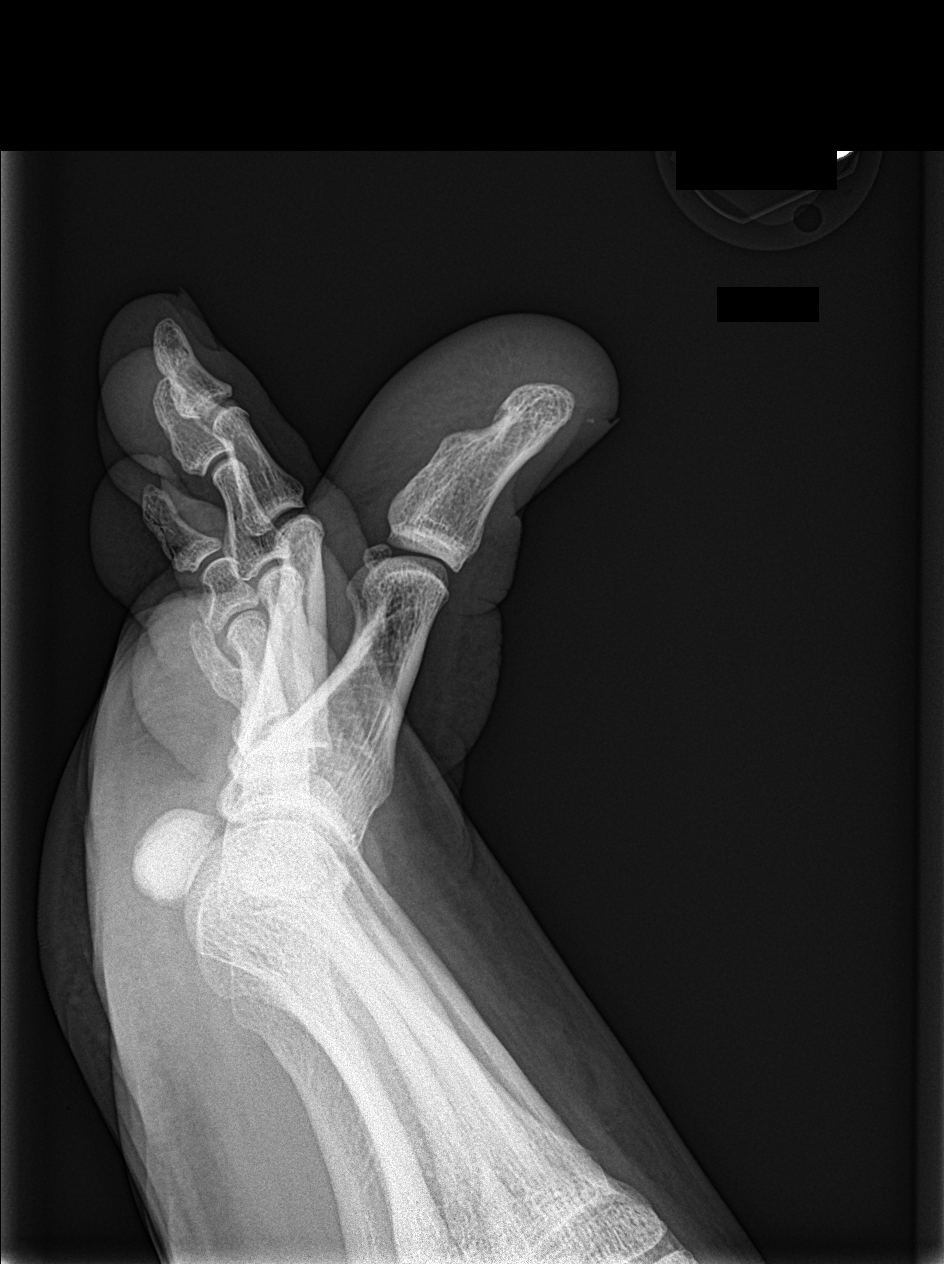

[4 of 4 positions shown; findings below may reference images not displayed]

FINDINGS: There is no evidence of fracture or dislocation. There is no
evidence of arthropathy or other focal bone abnormality. Soft
tissues are unremarkable.
IMPRESSION: Negative.
